# Patient Record
Sex: Male | Born: 1990 | Race: White | Hispanic: No | Marital: Single | Smoking: Current every day smoker
Health system: Southern US, Community
[De-identification: ages and names within clinical notes are randomized; demographics above are authoritative.]

## PROBLEM LIST (undated history)

## (undated) DIAGNOSIS — F32A Depression, unspecified: Secondary | ICD-10-CM

## (undated) DIAGNOSIS — F329 Major depressive disorder, single episode, unspecified: Secondary | ICD-10-CM

## (undated) DIAGNOSIS — F419 Anxiety disorder, unspecified: Secondary | ICD-10-CM

---
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
## 2010-06-04 ENCOUNTER — Emergency Department (HOSPITAL_COMMUNITY)
Admission: EM | Admit: 2010-06-04 | Discharge: 2010-06-04 | Disposition: A | Discharge status: Home / Self Care | Payer: Self-pay | Attending: Emergency Medicine | Admitting: Emergency Medicine

## 2010-06-04 DIAGNOSIS — R197 Diarrhea, unspecified: Secondary | ICD-10-CM | POA: Insufficient documentation

## 2010-06-04 DIAGNOSIS — R109 Unspecified abdominal pain: Secondary | ICD-10-CM | POA: Insufficient documentation

## 2010-06-04 DIAGNOSIS — R112 Nausea with vomiting, unspecified: Secondary | ICD-10-CM | POA: Insufficient documentation

## 2010-11-06 ENCOUNTER — Emergency Department (HOSPITAL_COMMUNITY)
Admission: EM | Admit: 2010-11-06 | Discharge: 2010-11-06 | Disposition: A | Discharge status: Home / Self Care | Payer: Self-pay | Attending: Emergency Medicine | Admitting: Emergency Medicine

## 2010-11-06 ENCOUNTER — Emergency Department (HOSPITAL_COMMUNITY): Payer: Self-pay

## 2010-11-06 ENCOUNTER — Encounter: Payer: Self-pay | Admitting: *Deleted

## 2010-11-06 DIAGNOSIS — S199XXA Unspecified injury of neck, initial encounter: Secondary | ICD-10-CM | POA: Insufficient documentation

## 2010-11-06 DIAGNOSIS — IMO0002 Reserved for concepts with insufficient information to code with codable children: Secondary | ICD-10-CM | POA: Insufficient documentation

## 2010-11-06 DIAGNOSIS — S0990XA Unspecified injury of head, initial encounter: Secondary | ICD-10-CM | POA: Insufficient documentation

## 2010-11-06 DIAGNOSIS — S0993XA Unspecified injury of face, initial encounter: Secondary | ICD-10-CM | POA: Insufficient documentation

## 2010-11-06 LAB — BASIC METABOLIC PANEL
BUN: 10 mg/dL (ref 6–23)
CO2: 27 mEq/L (ref 19–32)
Calcium: 10.4 mg/dL (ref 8.4–10.5)
Creatinine, Ser: 0.95 mg/dL (ref 0.50–1.35)
Glucose, Bld: 95 mg/dL (ref 70–99)

## 2010-11-06 LAB — DIFFERENTIAL
Eosinophils Absolute: 0.1 10*3/uL (ref 0.0–0.7)
Eosinophils Relative: 1 % (ref 0–5)
Lymphocytes Relative: 10 % — ABNORMAL LOW (ref 12–46)
Lymphs Abs: 1.9 10*3/uL (ref 0.7–4.0)
Monocytes Absolute: 1.1 10*3/uL — ABNORMAL HIGH (ref 0.1–1.0)

## 2010-11-06 LAB — CBC
HCT: 45.3 % (ref 39.0–52.0)
MCH: 32.9 pg (ref 26.0–34.0)
MCV: 92.4 fL (ref 78.0–100.0)
RBC: 4.9 MIL/uL (ref 4.22–5.81)
WBC: 18.2 10*3/uL — ABNORMAL HIGH (ref 4.0–10.5)

## 2010-11-06 MED ORDER — SODIUM CHLORIDE 0.9 % IV SOLN
Freq: Once | INTRAVENOUS | Status: AC
  Administered 2010-11-06: 06:00:00 via INTRAVENOUS

## 2010-11-06 NOTE — ED Notes (Signed)
Pt stating neck pain positive loc c-collar applied
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
## 2011-02-06 LAB — URINALYSIS, ROUTINE W REFLEX MICROSCOPIC
Bilirubin Urine: NEGATIVE
Glucose, UA: NEGATIVE
Hgb urine dipstick: NEGATIVE
Leukocytes, UA: NEGATIVE
Nitrite: NEGATIVE
Protein, ur: 30 — AB
Specific Gravity, Urine: 1.02
Urobilinogen, UA: 1
pH: 8.5 — ABNORMAL HIGH

## 2011-02-06 LAB — I-STAT 8, (EC8 V) (CONVERTED LAB)
BUN: 15
Chloride: 106
Glucose, Bld: 100 — ABNORMAL HIGH
Hemoglobin: 17.7 — ABNORMAL HIGH
pCO2, Ven: 36 — ABNORMAL LOW
pH, Ven: 7.435 — ABNORMAL HIGH

## 2011-02-06 LAB — URINE MICROSCOPIC-ADD ON

## 2014-06-30 ENCOUNTER — Encounter (HOSPITAL_COMMUNITY): Payer: Self-pay | Admitting: Emergency Medicine

## 2014-06-30 ENCOUNTER — Emergency Department (HOSPITAL_COMMUNITY)
Admission: EM | Admit: 2014-06-30 | Discharge: 2014-06-30 | Disposition: A | Discharge status: Home / Self Care | Payer: Self-pay | Attending: Emergency Medicine | Admitting: Emergency Medicine

## 2014-06-30 DIAGNOSIS — Z79899 Other long term (current) drug therapy: Secondary | ICD-10-CM | POA: Insufficient documentation

## 2014-06-30 DIAGNOSIS — K529 Noninfective gastroenteritis and colitis, unspecified: Secondary | ICD-10-CM | POA: Insufficient documentation

## 2014-06-30 DIAGNOSIS — Z720 Tobacco use: Secondary | ICD-10-CM | POA: Insufficient documentation

## 2014-06-30 LAB — BASIC METABOLIC PANEL
Anion gap: 9 (ref 5–15)
BUN: 13 mg/dL (ref 6–23)
CALCIUM: 10.1 mg/dL (ref 8.4–10.5)
CO2: 25 mmol/L (ref 19–32)
Chloride: 106 mmol/L (ref 96–112)
Creatinine, Ser: 0.95 mg/dL (ref 0.50–1.35)
GFR calc Af Amer: >90 mL/min (ref >90)
GLUCOSE: 93 mg/dL (ref 70–99)
Potassium: 4.4 mmol/L (ref 3.5–5.1)
SODIUM: 140 mmol/L (ref 135–145)

## 2014-06-30 MED ORDER — ONDANSETRON HCL 4 MG/2ML IJ SOLN
4.0000 mg | Freq: Once | INTRAMUSCULAR | Status: AC
  Administered 2014-06-30: 4 mg via INTRAVENOUS
  Filled 2014-06-30: qty 2

## 2014-06-30 MED ORDER — DIPHENOXYLATE-ATROPINE 2.5-0.025 MG PO TABS: 1.0000 | ORAL_TABLET | Freq: Four times a day (QID) | ORAL | Status: AC | PRN

## 2014-06-30 MED ORDER — GLYCOPYRROLATE 0.2 MG/ML IJ SOLN
0.1000 mg | Freq: Once | INTRAMUSCULAR | Status: AC
  Administered 2014-06-30: 0.1 mg via INTRAVENOUS
  Filled 2014-06-30: qty 1

## 2014-06-30 MED ORDER — DIPHENOXYLATE-ATROPINE 2.5-0.025 MG PO TABS
2.0000 | ORAL_TABLET | Freq: Once | ORAL | Status: AC
  Administered 2014-06-30: 2 via ORAL
  Filled 2014-06-30: qty 2

## 2014-06-30 MED ORDER — SODIUM CHLORIDE 0.9 % IV BOLUS (SEPSIS)
2000.0000 mL | Freq: Once | INTRAVENOUS | Status: AC
  Administered 2014-06-30: 2000 mL via INTRAVENOUS

## 2014-06-30 MED ORDER — ONDANSETRON 4 MG PO TBDP: 4.0000 mg | ORAL_TABLET | Freq: Three times a day (TID) | ORAL | Status: AC | PRN

## 2014-06-30 MED ORDER — DICYCLOMINE HCL 20 MG PO TABS: 20.0000 mg | ORAL_TABLET | Freq: Two times a day (BID) | ORAL | Status: AC

## 2014-06-30 NOTE — ED Provider Notes (Signed)
CSN: [id number]     Arrival date & time 06/30/14  1904 History  This chart was scribed for Rolland Porter, MD by Bronson Curb, ED Scribe. This patient was seen in room APA03/APA03 and the patient's care was started at 7:22 PM.   Chief Complaint  Patient presents with  . Emesis    The history is provided by the patient. No language interpreter was used.     HPI Comments: Lance Hines is a 24 y.o. male, with no significant medical history, who presents to the Emergency Department complaining of nausea with multiple episodes of vomiting that began today. Patient states he woke up with nausea and states it became progressively worse with vomiting occuring every 15 mintues. There is associated chills, 5 epiosdes of diarrhea, and mild abdominal discomfort. Patient has been trying to stay hydrated, but reports that he is unable to tolerate anything PO. He reports sick contacts with family experiencing similar symptoms, and reports they were treated yesterday. He denies fever or any other symptoms.   History reviewed. No pertinent past medical history. History reviewed. No pertinent past surgical history. History reviewed. No pertinent family history. History  Substance Use Topics  . Smoking status: Current Every Day Smoker -- 1.00 packs/day  . Smokeless tobacco: Not on file  . Alcohol Use: No    Review of Systems  Constitutional: Positive for chills. Negative for fever, diaphoresis, appetite change and fatigue.  HENT: Negative for mouth sores, sore throat and trouble swallowing.   Eyes: Negative for visual disturbance.  Respiratory: Negative for cough, chest tightness, shortness of breath and wheezing.   Cardiovascular: Negative for chest pain.  Gastrointestinal: Positive for nausea, vomiting, abdominal pain and diarrhea. Negative for abdominal distention.  Endocrine: Negative for polydipsia, polyphagia and polyuria.  Genitourinary: Negative for dysuria, frequency and hematuria.   Musculoskeletal: Negative for gait problem.  Skin: Negative for color change, pallor and rash.  Neurological: Negative for dizziness, syncope, light-headedness and headaches.  Hematological: Does not bruise/bleed easily.  Psychiatric/Behavioral: Negative for behavioral problems and confusion.      Allergies  Review of patient's allergies indicates no known allergies.  Home Medications   Prior to Admission medications   Medication Sig Start Date End Date Taking? Authorizing Provider  ALPRAZolam (XANAX PO) Take by mouth.      Historical Provider, MD  dicyclomine (BENTYL) 20 MG tablet Take 1 tablet (20 mg total) by mouth 2 (two) times daily. 06/30/14   Rolland Porter, MD  diphenoxylate-atropine (LOMOTIL) 2.5-0.025 MG per tablet Take 1 tablet by mouth 4 (four) times daily as needed for diarrhea or loose stools. 06/30/14   Rolland Porter, MD  ondansetron (ZOFRAN ODT) 4 MG disintegrating tablet Take 1 tablet (4 mg total) by mouth every 8 (eight) hours as needed for nausea. 06/30/14   Rolland Porter, MD   Triage Vitals: BP 99/56 mmHg  Pulse 83  Temp(Src) 97.9 F (36.6 C) (Oral)  Resp 24  Ht  (1.854 m)  Wt 220 lb (99.791 kg)  BMI 29.03 kg/m2  SpO2 100%  Physical Exam  Constitutional: He is oriented to person, place, and time. He appears well-developed and well-nourished. No distress.  HENT:  Head: Normocephalic.  Mouth/Throat: Mucous membranes are dry.  Eyes: Conjunctivae are normal. Pupils are equal, round, and reactive to light. No scleral icterus.  Neck: Normal range of motion. Neck supple. No thyromegaly present.  Cardiovascular: Normal rate and regular rhythm.  Exam reveals no gallop and no friction rub.   No murmur  heard. Pulmonary/Chest: Effort normal and breath sounds normal. No respiratory distress. He has no wheezes. He has no rales.  Abdominal: Soft. He exhibits no distension. Bowel sounds are decreased. There is no tenderness. There is no rebound.  Mild tenderness. Slightly  hypoactive bowel sounds.  Musculoskeletal: Normal range of motion.  Neurological: He is alert and oriented to person, place, and time.  Skin: Skin is warm and dry. No rash noted.  Psychiatric: He has a normal mood and affect. His behavior is normal.    ED Course  Procedures (including critical care time)  DIAGNOSTIC STUDIES: Oxygen Saturation is 100% on room air, normal by my interpretation.    COORDINATION OF CARE: At 1926 Discussed treatment plan with patient. Patient agrees.   Labs Review Labs Reviewed  BASIC METABOLIC PANEL    Imaging Review No results found.   EKG Interpretation None      MDM   Final diagnoses:  Gastroenteritis   is much improved after IV hydration, antiemetics, Lomotil. Labs reassuring. Plan is discharge home.   I personally performed the services described in this documentation, which was scribed in my presence. The recorded information has been reviewed and is accurate.    Rolland PorterMark Shimeka Bacot, MD 06/30/14 2051

## 2014-06-30 NOTE — ED Notes (Signed)
Onset this morning vomiting and diarrhea, multiple times all day

## 2014-06-30 NOTE — Discharge Instructions (Signed)
Viral Gastroenteritis Viral gastroenteritis is also called stomach flu. This illness is caused by a certain type of germ (virus). It can cause sudden watery poop (diarrhea) and throwing up (vomiting). This can cause you to lose body fluids (dehydration). This illness usually lasts for 3 to 8 days. It usually goes away on its own. HOME CARE   Drink enough fluids to keep your pee (urine) clear or pale yellow. Drink small amounts of fluids often.  Ask your doctor how to replace body fluid losses (rehydration).  Avoid:  Foods high in sugar.  Alcohol.  Bubbly (carbonated) drinks.  Tobacco.  Juice.  Caffeine drinks.  Very hot or cold fluids.  Fatty, greasy foods.  Eating too much at one time.  Dairy products until 24 to 48 hours after your watery poop stops.  You may eat foods with active cultures (probiotics). They can be found in some yogurts and supplements.  Wash your hands well to avoid spreading the illness.  Only take medicines as told by your doctor. Do not give aspirin to children. Do not take medicines for watery poop (antidiarrheals).  Ask your doctor if you should keep taking your regular medicines.  Keep all doctor visits as told. GET HELP RIGHT AWAY IF:   You cannot keep fluids down.  You do not pee at least once every 6 to 8 hours.  You are short of breath.  You see blood in your poop or throw up. This may look like coffee grounds.  You have belly (abdominal) pain that gets worse or is just in one small spot (localized).  You keep throwing up or having watery poop.  You have a fever.  The patient is a child younger than 3 months, and he or she has a fever.  The patient is a child older than 3 months, and he or she has a fever and problems that do not go away.  The patient is a child older than 3 months, and he or she has a fever and problems that suddenly get worse.  The patient is a baby, and he or she has no tears when crying. MAKE SURE YOU:     Understand these instructions.  Will watch your condition.  Will get help right away if you are not doing well or get worse. Document Released: 10/03/2007 Document Revised: 07/09/2011 Document Reviewed: 01/31/2011 Exit[employer] Patient Information 2015 [employer]. This information is not intended to replace advice given to you by your health care provider. Make sure you discuss any questions you have with your health care provider.  

## 2014-07-26 ENCOUNTER — Encounter (HOSPITAL_COMMUNITY): Payer: Self-pay

## 2014-07-26 ENCOUNTER — Emergency Department (HOSPITAL_COMMUNITY)
Admission: EM | Admit: 2014-07-26 | Discharge: 2014-07-27 | Disposition: A | Discharge status: Home / Self Care | Payer: Self-pay | Attending: Emergency Medicine | Admitting: Emergency Medicine

## 2014-07-26 DIAGNOSIS — F329 Major depressive disorder, single episode, unspecified: Secondary | ICD-10-CM | POA: Insufficient documentation

## 2014-07-26 DIAGNOSIS — F419 Anxiety disorder, unspecified: Secondary | ICD-10-CM | POA: Insufficient documentation

## 2014-07-26 DIAGNOSIS — R451 Restlessness and agitation: Secondary | ICD-10-CM

## 2014-07-26 DIAGNOSIS — F1210 Cannabis abuse, uncomplicated: Secondary | ICD-10-CM | POA: Insufficient documentation

## 2014-07-26 DIAGNOSIS — R45851 Suicidal ideations: Secondary | ICD-10-CM

## 2014-07-26 DIAGNOSIS — F1110 Opioid abuse, uncomplicated: Secondary | ICD-10-CM | POA: Insufficient documentation

## 2014-07-26 DIAGNOSIS — Z720 Tobacco use: Secondary | ICD-10-CM | POA: Insufficient documentation

## 2014-07-26 DIAGNOSIS — Z79899 Other long term (current) drug therapy: Secondary | ICD-10-CM | POA: Insufficient documentation

## 2014-07-26 HISTORY — DX: Depression, unspecified: F32.A

## 2014-07-26 HISTORY — DX: Major depressive disorder, single episode, unspecified: F32.9

## 2014-07-26 HISTORY — DX: Anxiety disorder, unspecified: F41.9

## 2014-07-26 LAB — COMPREHENSIVE METABOLIC PANEL
ALBUMIN: 5.3 g/dL — AB (ref 3.5–5.2)
ALT: 20 U/L (ref 0–53)
AST: 18 U/L (ref 0–37)
Alkaline Phosphatase: 88 U/L (ref 39–117)
Anion gap: 9 (ref 5–15)
BUN: 9 mg/dL (ref 6–23)
CALCIUM: 10 mg/dL (ref 8.4–10.5)
CO2: 29 mmol/L (ref 19–32)
Chloride: 104 mmol/L (ref 96–112)
Creatinine, Ser: 0.9 mg/dL (ref 0.50–1.35)
GFR calc Af Amer: >90 mL/min (ref >90)
GFR calc non Af Amer: >90 mL/min (ref >90)
Glucose, Bld: 94 mg/dL (ref 70–99)
Potassium: 4.4 mmol/L (ref 3.5–5.1)
Sodium: 142 mmol/L (ref 135–145)
TOTAL PROTEIN: 8.1 g/dL (ref 6.0–8.3)
Total Bilirubin: 1 mg/dL (ref 0.3–1.2)

## 2014-07-26 LAB — URINALYSIS, ROUTINE W REFLEX MICROSCOPIC
Bilirubin Urine: NEGATIVE
Glucose, UA: NEGATIVE mg/dL
Hgb urine dipstick: NEGATIVE
Ketones, ur: NEGATIVE mg/dL
LEUKOCYTES UA: NEGATIVE
NITRITE: NEGATIVE
PROTEIN: NEGATIVE mg/dL
Specific Gravity, Urine: 1.017 (ref 1.005–1.030)
UROBILINOGEN UA: 0.2 mg/dL (ref 0.0–1.0)
pH: 7.5 (ref 5.0–8.0)

## 2014-07-26 LAB — RAPID URINE DRUG SCREEN, HOSP PERFORMED
Amphetamines: NOT DETECTED
BENZODIAZEPINES: NOT DETECTED
Barbiturates: NOT DETECTED
COCAINE: NOT DETECTED
Opiates: NOT DETECTED
Tetrahydrocannabinol: POSITIVE — AB

## 2014-07-26 LAB — URINE MICROSCOPIC-ADD ON

## 2014-07-26 LAB — CBC
HCT: 47.1 % (ref 39.0–52.0)
Hemoglobin: 15.9 g/dL (ref 13.0–17.0)
MCH: 32.3 pg (ref 26.0–34.0)
MCHC: 33.8 g/dL (ref 30.0–36.0)
MCV: 95.5 fL (ref 78.0–100.0)
PLATELETS: 247 10*3/uL (ref 150–400)
RBC: 4.93 MIL/uL (ref 4.22–5.81)
RDW: 12 % (ref 11.5–15.5)
WBC: 8 10*3/uL (ref 4.0–10.5)

## 2014-07-26 LAB — SALICYLATE LEVEL: Salicylate Lvl: <4 mg/dL (ref 2.8–20.0)

## 2014-07-26 LAB — ACETAMINOPHEN LEVEL

## 2014-07-26 LAB — ETHANOL

## 2014-07-26 MED ORDER — ALUM & MAG HYDROXIDE-SIMETH 200-200-20 MG/5ML PO SUSP
30.0000 mL | ORAL | Status: DC | PRN
Start: 2014-07-26 — End: 2014-07-27

## 2014-07-26 MED ORDER — ZOLPIDEM TARTRATE 5 MG PO TABS
5.0000 mg | ORAL_TABLET | Freq: Every evening | ORAL | Status: DC | PRN
  Administered 2014-07-26: 5 mg via ORAL
  Filled 2014-07-26: qty 1

## 2014-07-26 MED ORDER — HALOPERIDOL 5 MG PO TABS: 5.0000 mg | ORAL_TABLET | Freq: Once | ORAL | Status: DC

## 2014-07-26 MED ORDER — IBUPROFEN 200 MG PO TABS: 600.0000 mg | ORAL_TABLET | Freq: Three times a day (TID) | ORAL | Status: DC | PRN

## 2014-07-26 MED ORDER — LORAZEPAM 1 MG PO TABS
1.0000 mg | ORAL_TABLET | Freq: Three times a day (TID) | ORAL | Status: DC | PRN
  Administered 2014-07-26 – 2014-07-27 (×2): 1 mg via ORAL
  Filled 2014-07-26 (×2): qty 1

## 2014-07-26 MED ORDER — ONDANSETRON HCL 4 MG PO TABS: 4.0000 mg | ORAL_TABLET | Freq: Three times a day (TID) | ORAL | Status: DC | PRN

## 2014-07-26 NOTE — ED Notes (Signed)
Patient remains anxious. Reports racing thoughts. Concerned about how long he will be on the unit before being placed for tx.

## 2014-07-26 NOTE — ED Notes (Signed)
Patient appears flat. Denies SI, HI, AVH. Rates feelings of anxiety 6/10, depression 8/10.   Encouragement offered.   Q 15 safety checks in place.

## 2014-07-26 NOTE — ED Notes (Signed)
Pt reported to MD that sometimes he gets so mad that he wants to "beat someone to death."  Sts he feels "worthless" and angry from the time he wakes to the time he goes to bed.
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
## 2014-07-26 NOTE — ED Notes (Signed)
Pt's visitor reports "I know, him and he needs Seroquel. He needs major help.  Three days will not be enough wherever he goes."
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
## 2014-07-26 NOTE — ED Notes (Signed)
Patient pacing in his room. Reports that anxiety continues. Calm, cooperative. Agrees to take an Ativan.  Q 15 safety checks continue.

## 2014-07-27 DIAGNOSIS — F1994 Other psychoactive substance use, unspecified with psychoactive substance-induced mood disorder: Secondary | ICD-10-CM

## 2014-07-27 DIAGNOSIS — F1120 Opioid dependence, uncomplicated: Secondary | ICD-10-CM

## 2014-07-27 NOTE — Progress Notes (Signed)
Pt was pacing in his room. Upon approach, he said he was feeling anxious after a phone call in which he learned his mom had been going through his phone. He said he felt like he needed Xanax and Ambien. Offered Ativan, which he accepted. Pt said he felt he should call his mom but said he was so upset. This Clinical research associatewriter suggested he try to relax before he makes any potentially upsetting calls. Pt remains appropriate and polite. Will continue to monitor for needs/safety.

## 2014-07-27 NOTE — ED Notes (Signed)
Pt acuity note: Low Pt denies SI/HI/AVH. Says he is here to detox from Opanna. No physical complaints. Will continue to monitor for needs/safety.

## 2014-07-27 NOTE — Progress Notes (Signed)
Per psychiatrist, patient psychiatrically stable for discharge home. Pt shares he as just released from jail after being their for 15 days. Pt wanting to go to rehab. Patient also interested in suboxone clinic. CSW and pt discussed resources. Patient calling [facility] for information and intake. Patient plans to follow up with [facility] in [place] for mental health treatment. Pt states he can have a ride home at 2pm.   Olga CoasterKristen Jelisha Weed, LCSW  Clinical Social Work  [facility] [phone]

## 2014-07-27 NOTE — ED Notes (Signed)
Pt acuity note: Low Pt is calm, cooperative, and polite.
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
## 2014-07-27 NOTE — ED Notes (Signed)
Pt is on phone now. He reports depression 8/10 but denies SI/HI.

## 2014-07-27 NOTE — ED Notes (Addendum)
Pt's girlfriend, Davonna BellingGabriela Pike [phone]), called to express concerns about pt's discharge. "He cannot be discharged," she said. Added that pt was having SI and would be a danger if released. Pt has denied SI. "I can't lie about it," he said. MD and LCSW notified. Pt now is on phone making phone calls about treatment options.

## 2014-07-27 NOTE — [facility] Suicide Risk Assessment (Signed)
[facility]   Demographic Factors:  24 year old male  Total Time spent with patient: 45 minutes  Musculoskeletal: Strength & Muscle Tone: within normal limits Gait & Station: normal Patient leans: N/A  Psychiatric Specialty Exam: Physical Exam  ROS  Blood pressure 123/78, pulse 82, temperature 98.1 F (36.7 C), temperature source Oral, resp. rate 16, SpO2 100 %.There is no weight on file to calculate BMI.  General Appearance: Well Groomed  Patent attorneyye Contact::  Good  Speech:  Clear and Coherent409  Volume:  Normal  Mood:  Anxious  Affect:  Appropriate  Thought Process:  Coherent  Orientation:  Full (Time, Place, and Person)  Thought Content:  Negative  Suicidal Thoughts:  No  Homicidal Thoughts:  No  Memory:  Immediate;   Good Recent;   Good Remote;   Good  Judgement:  Fair  Insight:  Fair  Psychomotor Activity:  Normal  Concentration:  Good  Recall:  Good  Fund of Knowledge:Good  Language: Good  Akathisia:  Negative  Handed:  Right  AIMS (if indicated):     Assets:  Communication Skills Desire for Improvement Physical Health  Sleep:     Cognition: WNL  ADL's:  Intact      Has this patient used any form of tobacco in the last 30 days? (Cigarettes, Smokeless Tobacco, Cigars, and/or Pipes) N/A  Mental Status Per Nursing Assessment::   On Admission:     Current Mental Status by Physician: NA  Loss Factors: NA  Historical Factors: NA  Risk Reduction Factors:   NA  Continued Clinical Symptoms:  Alcohol/Substance Abuse/Dependencies  Cognitive Features That Contribute To Risk:  Closed-mindedness    Suicide Risk:  Minimal: No identifiable suicidal ideation.  Patients presenting with no risk factors but with morbid ruminations; may be classified as minimal risk based on the severity of the depressive symptoms  Principal Problem: <principal problem not specified> Discharge Diagnoses: There are no active problems to display for this  patient.   Follow-up Information    [facility]   Contact information:   [phone]      Please follow up.   Why:  walk in hours 530 am to 930 am   Contact information:   [facility]  [address] [place], [place] [address] [phone]       Please follow up.   Contact information:   [facility]  [address], [place], [place] [address] [phone]      Plan Of Care/Follow-up recommendations:  Activity:  resume usual activity Diet:  resume usual diet  Is patient on multiple antipsychotic therapies at discharge:  No   Has Patient had three or more failed trials of antipsychotic monotherapy by history:  No  Recommended Plan for Multiple Antipsychotic Therapies: NA    TAYLOR,GERALD D 07/27/2014, 12:25 PM

## 2014-07-27 NOTE — ED Notes (Signed)
Pt discharged to lobby by [facility] with belongings. Pt stable, denies pain, and denies SI. AVS given, resource numbers provided. [facility]

## 2017-10-28 ENCOUNTER — Encounter (HOSPITAL_COMMUNITY): Payer: Self-pay | Admitting: Emergency Medicine

## 2017-10-28 ENCOUNTER — Emergency Department (HOSPITAL_COMMUNITY)
Admission: EM | Admit: 2017-10-28 | Discharge: 2017-10-29 | Disposition: A | Discharge status: Home / Self Care | Payer: Self-pay | Attending: Emergency Medicine | Admitting: Emergency Medicine

## 2017-10-28 ENCOUNTER — Other Ambulatory Visit: Payer: Self-pay

## 2017-10-28 DIAGNOSIS — S30861A Insect bite (nonvenomous) of abdominal wall, initial encounter: Secondary | ICD-10-CM | POA: Insufficient documentation

## 2017-10-28 DIAGNOSIS — W57XXXA Bitten or stung by nonvenomous insect and other nonvenomous arthropods, initial encounter: Secondary | ICD-10-CM

## 2017-10-28 DIAGNOSIS — Y929 Unspecified place or not applicable: Secondary | ICD-10-CM | POA: Insufficient documentation

## 2017-10-28 DIAGNOSIS — Y999 Unspecified external cause status: Secondary | ICD-10-CM | POA: Insufficient documentation

## 2017-10-28 DIAGNOSIS — F17210 Nicotine dependence, cigarettes, uncomplicated: Secondary | ICD-10-CM | POA: Insufficient documentation

## 2017-10-28 DIAGNOSIS — Y939 Activity, unspecified: Secondary | ICD-10-CM | POA: Insufficient documentation

## 2017-10-28 MED ORDER — DOXYCYCLINE HYCLATE 100 MG PO CAPS: 100.0000 mg | ORAL_CAPSULE | Freq: Two times a day (BID) | ORAL | 0 refills | Status: AC

## 2017-10-28 MED ORDER — PROMETHAZINE HCL 25 MG PO TABS: 25.0000 mg | ORAL_TABLET | Freq: Four times a day (QID) | ORAL | 0 refills | Status: AC | PRN

## 2017-10-28 MED ORDER — PROMETHAZINE HCL 12.5 MG PO TABS
25.0000 mg | ORAL_TABLET | Freq: Once | ORAL | Status: AC
  Administered 2017-10-29: 25 mg via ORAL
  Filled 2017-10-28: qty 2

## 2017-10-28 NOTE — ED Provider Notes (Signed)
[facility] EMERGENCY DEPARTMENT Provider Note   CSN: [id number] Arrival date & time: 10/28/17  2222     History   Chief Complaint Chief Complaint  Patient presents with  . Tick bite    HPI Lance Hines is a 27 y.o. male.  HPI   Lance Hines is a 27 y.o. male who presents to the Emergency Department complaining of tick bite 2-3 weeks ago.  States that he noticed a "dog tick" to his right lower abdomen and removed himself when first noticed.  Tick was not engorged.  Several days ago, he noticed red ring around the site of the tick bite and associated with localized itching.  He also describes having generalized body aches, nausea, decreased appetite and " I just don't feel right."  He denies fever, vomiting, arthralgias, rash, headache and neck pain or stiffness.  He is concerned that he may have Lyme disease.      Past Medical History:  Diagnosis Date  . Anxiety   . Depression     There are no active problems to display for this patient.   History reviewed. No pertinent surgical history.      Home Medications    Prior to Admission medications   Medication Sig Start Date End Date Taking? Authorizing Provider  dicyclomine (BENTYL) 20 MG tablet Take 1 tablet (20 mg total) by mouth 2 (two) times daily. Patient not taking: Reported on 07/26/2014 06/30/14   Rolland Porter, MD  diphenoxylate-atropine (LOMOTIL) 2.5-0.025 MG per tablet Take 1 tablet by mouth 4 (four) times daily as needed for diarrhea or loose stools. Patient not taking: Reported on 07/26/2014 06/30/14   Rolland Porter, MD  doxycycline (VIBRAMYCIN) 100 MG capsule Take 1 capsule (100 mg total) by mouth 2 (two) times daily. 10/28/17   Honore Wipperfurth, PA-C  hydrOXYzine (ATARAX/VISTARIL) 25 MG tablet Take 25 mg by mouth 2 (two) times daily.    [provider]  ondansetron (ZOFRAN ODT) 4 MG disintegrating tablet Take 1 tablet (4 mg total) by mouth every 8 (eight) hours as needed for nausea. Patient not taking:  Reported on 07/26/2014 06/30/14   Rolland Porter, MD  promethazine (PHENERGAN) 25 MG tablet Take 1 tablet (25 mg total) by mouth every 6 (six) hours as needed for nausea or vomiting. 10/28/17   Giovan Pinsky, Babette Relic, PA-C    Family History No family history on file.  Social History Social History   Tobacco Use  . Smoking status: Current Every Day Smoker    Packs/day: 1.00    Types: Cigarettes  . Smokeless tobacco: Never Used  Substance Use Topics  . Alcohol use: No  . Drug use: Yes    Comment: opiates     Allergies   Patient has no known allergies.   Review of Systems Review of Systems  Constitutional: Positive for appetite change and fatigue. Negative for activity change, chills and fever.  HENT: Negative for facial swelling, sore throat and trouble swallowing.   Respiratory: Negative for chest tightness, shortness of breath and wheezing.   Cardiovascular: Negative for chest pain.  Gastrointestinal: Positive for nausea. Negative for abdominal pain and vomiting.  Musculoskeletal: Positive for myalgias. Negative for arthralgias, neck pain and neck stiffness.  Skin: Positive for rash. Negative for wound.       Tick bite  Neurological: Negative for dizziness, weakness, numbness and headaches.     Physical Exam Updated Vital Signs BP (!) 145/99 (BP Location: Right Arm)   Pulse 86   Temp 98.1 F (36.7  C) (Temporal)   Resp 18   Ht 6\' 1"  (1.854 m)   Wt 113.4 kg (250 lb)   SpO2 98%   BMI 32.98 kg/m   Physical Exam  Constitutional: He appears well-developed and well-nourished. No distress.  HENT:  Head: Normocephalic and atraumatic.  Mouth/Throat: Oropharynx is clear and moist.  Neck: Normal range of motion. Neck supple.  Cardiovascular: Normal rate, regular rhythm and intact distal pulses.  No murmur heard. Pulmonary/Chest: Effort normal and breath sounds normal. No respiratory distress.  Musculoskeletal: He exhibits no edema or tenderness.  Lymphadenopathy:    He has no  cervical adenopathy.  Neurological: He is alert. No sensory deficit. He exhibits normal muscle tone.  Skin: Skin is warm. Capillary refill takes less than 2 seconds. Rash noted. There is erythema.  Small erythematous papule to the right lower abdomen with erythematous ring with central clearing.    Nursing note and vitals reviewed.    ED Treatments / Results  Labs (all labs ordered are listed, but only abnormal results are displayed) Labs Reviewed - No data to display  EKG None  Radiology No results found.  Procedures Procedures (including critical care time)  Medications Ordered in ED Medications  promethazine (PHENERGAN) tablet 25 mg (has no administration in time range)     Initial Impression / Assessment and Plan / ED Course  I have reviewed the triage vital signs and the nursing notes.  Pertinent labs & imaging results that were available during my care of the patient were reviewed by me and considered in my medical decision making (see chart for details).     Pt is well appearing.  Vitals reviewed.  Small amt of irregular erythema surrounding the site of the tick bite.  No other associated symptoms.  Will start doxy.  Pt agrees to PCP f/u, return precautions discussed.   Final Clinical Impressions(s) / ED Diagnoses   Final diagnoses:  Tick bite of abdominal wall, initial encounter    ED Discharge Orders        Ordered    doxycycline (VIBRAMYCIN) 100 MG capsule  2 times daily     10/28/17 2319    promethazine (PHENERGAN) 25 MG tablet  Every 6 hours PRN     10/28/17 2319       Pauline Ausriplett, Analiese Krupka, PA-C 10/28/17 2354    Zadie RhineWickline, Donald, MD 10/29/17 [id number]

## 2017-10-28 NOTE — Discharge Instructions (Addendum)
Take the antibiotic as directed until it is finished.  Do not take on an empty stomach.  Also, apply 1% hydrocortisone cream that you may buy from the drugstore to the affected area 3 times a day.  You may take Benadryl as directed if needed for itching.  Follow-up with your primary doctor or return to the ER for any worsening symptoms such as fever, severe headache, vomiting, or worsening rash

## 2017-10-28 NOTE — ED Triage Notes (Signed)
Pt states he was bit by a tick around 2-3 weeks ago. Pt states since then he has not been able to eat and "has not been himself." Pt denies fevers and N/V.

## 2019-01-29 ENCOUNTER — Other Ambulatory Visit: Payer: Self-pay | Admitting: *Deleted

## 2019-01-29 DIAGNOSIS — Z20822 Contact with and (suspected) exposure to covid-19: Secondary | ICD-10-CM

## 2019-01-30 LAB — NOVEL CORONAVIRUS, NAA: SARS-CoV-2, NAA: NOT DETECTED

## 2020-07-19 ENCOUNTER — Other Ambulatory Visit: Payer: Self-pay

## 2020-07-19 ENCOUNTER — Encounter (HOSPITAL_COMMUNITY): Payer: Self-pay

## 2020-07-19 ENCOUNTER — Emergency Department (HOSPITAL_COMMUNITY)
Admission: EM | Admit: 2020-07-19 | Discharge: 2020-07-19 | Disposition: A | Discharge status: Home / Self Care | Payer: Self-pay | Attending: Emergency Medicine | Admitting: Emergency Medicine

## 2020-07-19 ENCOUNTER — Emergency Department (HOSPITAL_COMMUNITY): Payer: Self-pay

## 2020-07-19 DIAGNOSIS — K644 Residual hemorrhoidal skin tags: Secondary | ICD-10-CM | POA: Insufficient documentation

## 2020-07-19 DIAGNOSIS — M545 Low back pain, unspecified: Secondary | ICD-10-CM | POA: Insufficient documentation

## 2020-07-19 DIAGNOSIS — R319 Hematuria, unspecified: Secondary | ICD-10-CM | POA: Insufficient documentation

## 2020-07-19 DIAGNOSIS — N50812 Left testicular pain: Secondary | ICD-10-CM | POA: Insufficient documentation

## 2020-07-19 DIAGNOSIS — F17210 Nicotine dependence, cigarettes, uncomplicated: Secondary | ICD-10-CM | POA: Insufficient documentation

## 2020-07-19 DIAGNOSIS — N50811 Right testicular pain: Secondary | ICD-10-CM | POA: Insufficient documentation

## 2020-07-19 LAB — CBC WITH DIFFERENTIAL/PLATELET
Abs Immature Granulocytes: 0.02 10*3/uL (ref 0.00–0.07)
Basophils Absolute: 0.1 10*3/uL (ref 0.0–0.1)
Basophils Relative: 1 %
Eosinophils Absolute: 0.1 10*3/uL (ref 0.0–0.5)
Eosinophils Relative: 1 %
HCT: 44.1 % (ref 39.0–52.0)
Hemoglobin: 15.3 g/dL (ref 13.0–17.0)
Immature Granulocytes: 0 %
Lymphocytes Relative: 34 %
Lymphs Abs: 2.4 10*3/uL (ref 0.7–4.0)
MCH: 33.3 pg (ref 26.0–34.0)
MCHC: 34.7 g/dL (ref 30.0–36.0)
MCV: 95.9 fL (ref 80.0–100.0)
Monocytes Absolute: 0.5 10*3/uL (ref 0.1–1.0)
Monocytes Relative: 7 %
Neutro Abs: 3.9 10*3/uL (ref 1.7–7.7)
Neutrophils Relative %: 57 %
Platelets: 256 10*3/uL (ref 150–400)
RBC: 4.6 MIL/uL (ref 4.22–5.81)
RDW: 12.4 % (ref 11.5–15.5)
WBC: 6.9 10*3/uL (ref 4.0–10.5)
nRBC: 0 % (ref 0.0–0.2)

## 2020-07-19 LAB — BASIC METABOLIC PANEL
Anion gap: 7 (ref 5–15)
BUN: 9 mg/dL (ref 6–20)
CO2: 28 mmol/L (ref 22–32)
Calcium: 9.7 mg/dL (ref 8.9–10.3)
Chloride: 107 mmol/L (ref 98–111)
Creatinine, Ser: 0.88 mg/dL (ref 0.61–1.24)
GFR, Estimated: >60 mL/min (ref >60)
Glucose, Bld: 83 mg/dL (ref 70–99)
Potassium: 4.2 mmol/L (ref 3.5–5.1)
Sodium: 142 mmol/L (ref 135–145)

## 2020-07-19 LAB — URINALYSIS, ROUTINE W REFLEX MICROSCOPIC
Bacteria, UA: NONE SEEN
Bilirubin Urine: NEGATIVE
Glucose, UA: NEGATIVE mg/dL
Hgb urine dipstick: NEGATIVE
Ketones, ur: NEGATIVE mg/dL
Leukocytes,Ua: NEGATIVE
Nitrite: POSITIVE — AB
Protein, ur: NEGATIVE mg/dL
Specific Gravity, Urine: 1.01 (ref 1.005–1.030)
pH: 8 (ref 5.0–8.0)

## 2020-07-19 NOTE — ED Provider Notes (Signed)
[facility]-EMERGENCY DEPT Provider Note   CSN: [id number] Arrival date & time: 07/19/20  1556     History Chief Complaint  Patient presents with  . Hematuria  . Back Pain    Lance Hines is a 30 y.o. male who presents with 3 days of blood in his urine and in his semen associated with bilateral low back pain which he describes as aching pain 5/10 in severity.  He denies any pain in his penis, deformity, or skin lesions.  He endorses intermittent sore sensation in his testicles bilaterally without swelling, skin changes, or redness.  He states he is sexually active daily, currently has approximately 107 active male sexual partners.  Denies any sexual activity with men.  He denies any history of sexually transmitted infection, but states he regularly has multiple sexual partners.  He states he is sexually active daily, denies any history of hematuria in the past.  He does not use barrier protection with condoms when he is sexually active.  He states that he does not know of any recent exposure to sexually transmitted infections.  I personally reviewed this patient's medical records.  Has history of anxiety and depression, hydroxyzine. He otherwise carries no medical diagnoses.   HPI     Past Medical History:  Diagnosis Date  . Anxiety   . Depression     There are no problems to display for this patient.   History reviewed. No pertinent surgical history.     Family History  Family history unknown: Yes    Social History   Tobacco Use  . Smoking status: Current Every Day Smoker    Packs/day: 1.00    Types: Cigarettes  . Smokeless tobacco: Never Used  Vaping Use  . Vaping Use: Never used  Substance Use Topics  . Alcohol use: No  . Drug use: Not Currently    Home Medications Prior to Admission medications   Medication Sig Start Date End Date Taking? Authorizing Provider  dicyclomine (BENTYL) 20 MG tablet Take 1 tablet (20 mg total) by mouth 2  (two) times daily. Patient not taking: Reported on 07/26/2014 06/30/14   Rolland Porter, MD  diphenoxylate-atropine (LOMOTIL) 2.5-0.025 MG per tablet Take 1 tablet by mouth 4 (four) times daily as needed for diarrhea or loose stools. Patient not taking: Reported on 07/26/2014 06/30/14   Rolland Porter, MD  doxycycline (VIBRAMYCIN) 100 MG capsule Take 1 capsule (100 mg total) by mouth 2 (two) times daily. 10/28/17   Triplett, Tammy, PA-C  hydrOXYzine (ATARAX/VISTARIL) 25 MG tablet Take 25 mg by mouth 2 (two) times daily.    [provider]  ondansetron (ZOFRAN ODT) 4 MG disintegrating tablet Take 1 tablet (4 mg total) by mouth every 8 (eight) hours as needed for nausea. Patient not taking: Reported on 07/26/2014 06/30/14   Rolland Porter, MD  promethazine (PHENERGAN) 25 MG tablet Take 1 tablet (25 mg total) by mouth every 6 (six) hours as needed for nausea or vomiting. 10/28/17   Triplett, Tammy, PA-C    Allergies    Patient has no known allergies.  Review of Systems   Review of Systems  Constitutional: Negative.   HENT: Negative.   Eyes: Negative.   Respiratory: Negative.   Cardiovascular: Negative.   Gastrointestinal: Negative.   Genitourinary: Positive for decreased urine volume, flank pain, hematuria and testicular pain. Negative for difficulty urinating, dysuria, frequency, penile discharge, penile pain, penile swelling, scrotal swelling and urgency.  Skin: Negative.   Neurological: Negative.  Hematological: Negative.     Physical Exam Updated Vital Signs BP 112/67   Pulse 61   Temp 97.7 F (36.5 C) (Oral)   Resp 14   Ht  (1.854 m)   Wt 104.3 kg   SpO2 99%   BMI 30.34 kg/m   Physical Exam Vitals and nursing note reviewed. Exam conducted with a chaperone present.  HENT:     Head: Normocephalic and atraumatic.     Nose: Nose normal.     Mouth/Throat:     Mouth: Mucous membranes are moist.     Pharynx: Oropharynx is clear. Uvula midline. No oropharyngeal exudate or posterior  oropharyngeal erythema.     Tonsils: No tonsillar exudate.  Eyes:     General: Lids are normal. Vision grossly intact.        Right eye: No discharge.        Left eye: No discharge.     Extraocular Movements: Extraocular movements intact.     Conjunctiva/sclera: Conjunctivae normal.     Pupils: Pupils are equal, round, and reactive to light.  Neck:     Trachea: Phonation normal.  Cardiovascular:     Rate and Rhythm: Normal rate and regular rhythm.     Pulses: Normal pulses.     Heart sounds: Normal heart sounds. No murmur heard.   Pulmonary:     Effort: Pulmonary effort is normal. No respiratory distress.     Breath sounds: Normal breath sounds. No wheezing or rales.  Chest:     Chest wall: No deformity, swelling, tenderness, crepitus or edema.  Abdominal:     General: Bowel sounds are normal. There is no distension.     Palpations: Abdomen is soft.     Tenderness: There is no abdominal tenderness. There is no right CVA tenderness, left CVA tenderness, guarding or rebound.  Genitourinary:    Penis: Normal and circumcised. No tenderness, discharge, swelling or lesions.      Testes: Normal.        Right: Mass, tenderness, swelling, testicular hydrocele or varicocele not present.        Left: Mass, tenderness, swelling, testicular hydrocele or varicocele not present.     Epididymis:     Right: Normal.     Left: Normal.     Prostate: Normal. Not enlarged, not tender and no nodules present.     Rectum: External hemorrhoid present. No mass, tenderness, anal fissure or internal hemorrhoid.     Comments: Rectal exam with normal-feeling prostate without tenderness to palpation.  Prostate is not boggy or edematous, is not firm to palpation.  Stool on glove does not appear melanotic.   Musculoskeletal:        General: No deformity.     Cervical back: Normal range of motion and neck supple. No rigidity, tenderness or crepitus. No pain with movement or spinous process tenderness.     Right  lower leg: No edema.     Left lower leg: No edema.  Lymphadenopathy:     Cervical: No cervical adenopathy.  Skin:    General: Skin is warm and dry.     Capillary Refill: Capillary refill takes less than 2 seconds.  Neurological:     Mental Status: He is alert and oriented to person, place, and time. Mental status is at baseline.     Sensory: Sensation is intact.     Motor: Motor function is intact.     Gait: Gait is intact.  Psychiatric:  Mood and Affect: Mood normal.     ED Results / Procedures / Treatments   Labs (all labs ordered are listed, but only abnormal results are displayed) Labs Reviewed  URINALYSIS, ROUTINE W REFLEX MICROSCOPIC - Abnormal; Notable for the following components:      Result Value   Color, Urine AMBER (*)    Nitrite POSITIVE (*)    All other components within normal limits  URINE CULTURE  BASIC METABOLIC PANEL  CBC WITH DIFFERENTIAL/PLATELET  GC/CHLAMYDIA PROBE AMP ([place]) NOT AT [facility]    EKG None  Radiology CT Renal Stone Study  Result Date: 07/19/2020 CLINICAL DATA:  Flank pain and hematuria. EXAM: CT ABDOMEN AND PELVIS WITHOUT CONTRAST TECHNIQUE: Multidetector CT imaging of the abdomen and pelvis was performed following the standard protocol without IV contrast. COMPARISON:  None. FINDINGS: Lower chest: No acute findings. Hepatobiliary: No mass visualized on this unenhanced exam. Gallbladder is unremarkable. No evidence of biliary ductal dilatation. Pancreas: No mass or inflammatory process visualized on this unenhanced exam. Spleen:  Within normal limits in size. Adrenals/Urinary tract: No evidence of urolithiasis or hydronephrosis. Unremarkable unopacified urinary bladder. Stomach/Bowel: No evidence of obstruction, inflammatory process, or abnormal fluid collections. Normal appendix visualized. Vascular/Lymphatic: No pathologically enlarged lymph nodes identified. No evidence of abdominal aortic aneurysm. Reproductive:  No mass or other  significant abnormality. Other:  None. Musculoskeletal:  No suspicious bone lesions identified. IMPRESSION: Negative. No evidence of urolithiasis, hydronephrosis, or other acute findings. Electronically Signed   By: Danae Orleans M.D.   On: 07/19/2020 18:59    Procedures Procedures Medications Ordered in ED Medications - No data to display  ED Course  I have reviewed the triage vital signs and the nursing notes.  Pertinent labs & imaging results that were available during my care of the patient were reviewed by me and considered in my medical decision making (see chart for details).    MDM Rules/Calculators/A&P                         30 year old male who presents with concern for 3 days of hematuria and blood in his semen, as well as bilateral flank pain. No hx of STD or nephrolithiasis, but participates in high risk sexual activity.   Differential diagnosis for this patient symptoms include but are not limited to urinary tract infection, nephrolithiasis, STD, neoplasm, GU trauma, neoplasm, prostatitis, foreign body, glomerulonephritis pruritus, AVM or fistula coagulopathy.  Vital signs are normal on intake, physical exam is reassuring.  cardiopulmonary exam is normal, abdominal exam is benign, GU exam revealed normal appearing penis and testicles without skin changes or penile discharge.  Rectal exam with normal-feeling prostate without tenderness to palpation.  Prostate is not boggy or edematous, is not firm to palpation.  Stool on glove does not appear melanotic.  UA performed in triage nitrate positive was otherwise unremarkable.  Will proceed with basic laboratory studies and CT renal study. CBC unremarkable, BMP unremarkable, UA nitrate positive but otherwise unremarkable. CT renal study negative without evidence of urolithiasis, hydronephrosis, or other acute findings.  Reassuring physical exam, vital signs, laboratory studies, and imaging, no further work-up is warranted in the ED at  this time.  While exact etiology of this patient's symptoms remains unclear, there is not currently any evidence of hematuria on his workup and there does not appear to be any emergent etiology for his symptoms.  Recommend close urology follow-up and recheck of his urine within the next 2  weeks.  Additionally GC/chlamydia pending, he should abstain from sexual intercourse until test results come back.  May return to the ED or urgent care for treatment if it is positive.  Jerico voiced understanding with medical evaluation and treatment plan.  Each of his questions was answered to his expressed satisfaction.  Return precautions are given.  Patient is well-appearing, stable, appropriate for discharge at this time.  This chart was dictated using voice recognition software, Dragon. Despite the best efforts of this provider to proofread and correct errors, errors may still occur which can change documentation meaning.  Final Clinical Impression(s) / ED Diagnoses Final diagnoses:  Hematuria, unspecified type    Rx / DC Orders ED Discharge Orders    None       Paris Lore, PA-C 07/19/20 2104    Rozelle Logan, DO 07/19/20 2356

## 2020-07-19 NOTE — Discharge Instructions (Addendum)
You are seen in the ER for concern of blood in your urine.  Your physical exam, vital signs, blood work, urine test, and CT scan were very reassuring.  While the exact cause of the symptoms you experienced remains unclear, there is not appear to be any emergent issue.  You have been tested for gonorrhea and chlamydia.  You should abstain from having sex until this test results come back within the next 72 hours.  If they are positive you should return to the ER or to urgent care to be treated.  Additionally you should inform all of your partners of your test positive.  Below is contact information for Dr. Marlou Porch, the urologist, whom you may follow-up with if your symptoms persist.  Return to the emergency department develop any new pain in your testicles, new penile discharge, abdominal pain, nausea vomiting does not stop, or any other new severe symptoms.

## 2020-07-19 NOTE — ED Triage Notes (Addendum)
Patient c/o bilateral lower back pain, blood in his semen, and hematuria x 3 days.

## 2020-07-20 LAB — GC/CHLAMYDIA PROBE AMP (~~LOC~~) NOT AT ARMC
Chlamydia: NEGATIVE
Comment: NEGATIVE
Comment: NORMAL
Neisseria Gonorrhea: NEGATIVE

## 2020-07-21 LAB — URINE CULTURE: Culture: NO GROWTH

## 2022-11-11 IMAGING — CT CT RENAL STONE PROTOCOL
2 of 4 series · 16 of 46 positions shown, 18 images · non-contrast
Comparison: None.

CLINICAL DATA: Flank pain and hematuria.

EXAM:
CT ABDOMEN AND PELVIS WITHOUT CONTRAST
TECHNIQUE: Multidetector CT imaging of the abdomen and pelvis was performed
following the standard protocol without IV contrast.

[Series 2: axial st · axial · 0.94mm/px · z∈[-600,-150]mm · 13 of 102 slices shown, 15 images]
[im 6/102  soft-tissue]
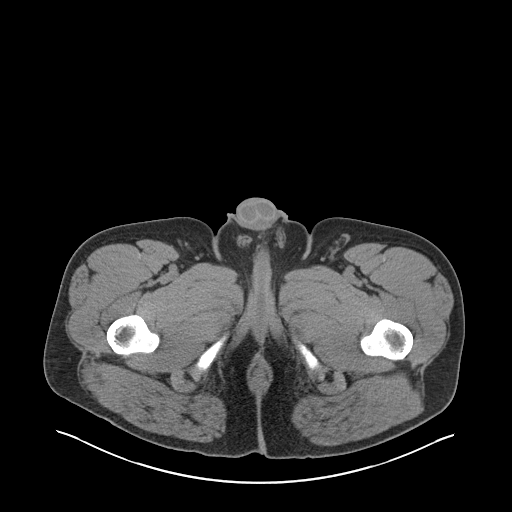
[im 6/102  bone]
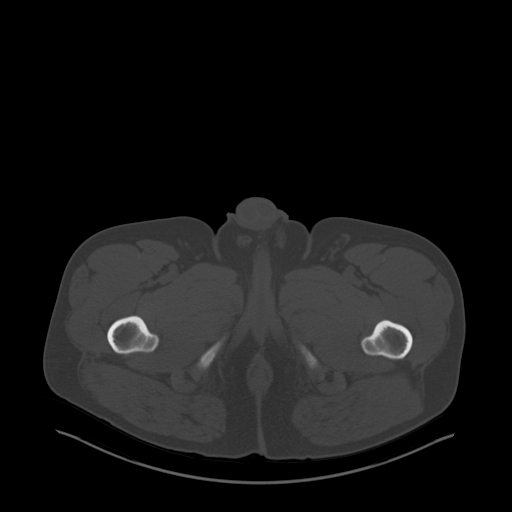
[im 16/102  soft-tissue]
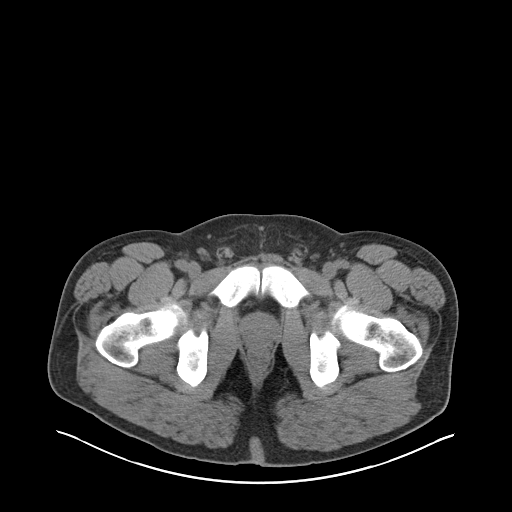
[im 22/102  soft-tissue]
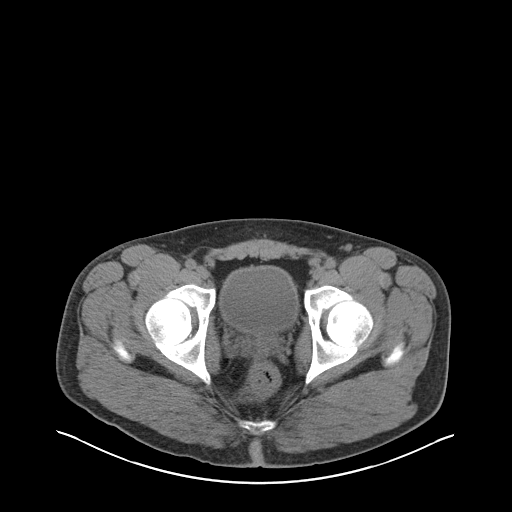
[im 27/102  soft-tissue]
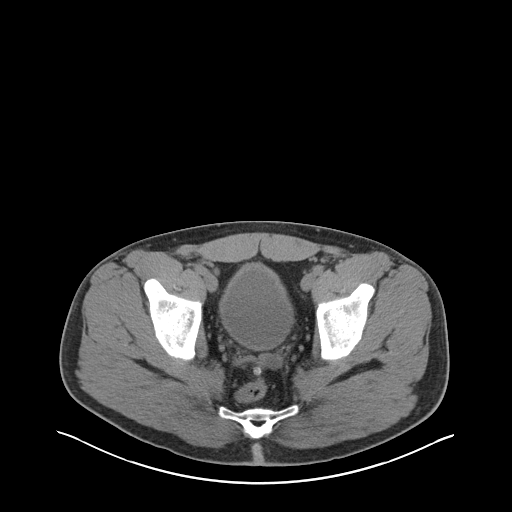
[im 38/102  soft-tissue]
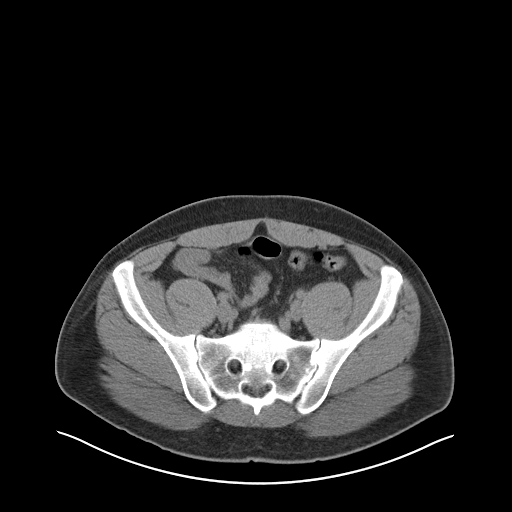
[im 43/102  soft-tissue]
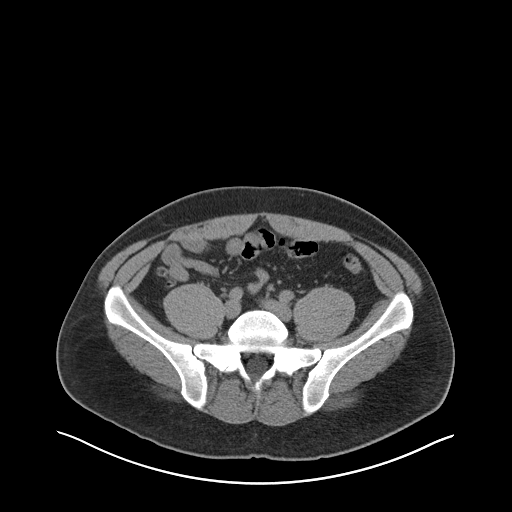
[im 54/102  soft-tissue]
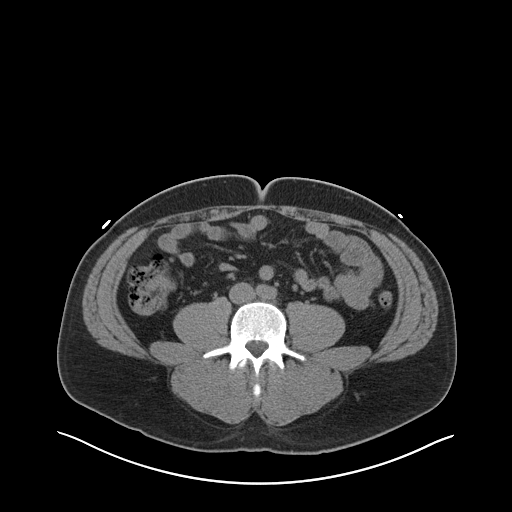
[im 59/102  soft-tissue]
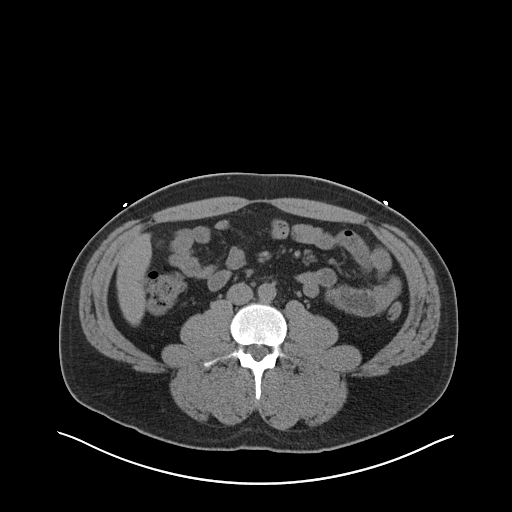
[im 64/102  soft-tissue]
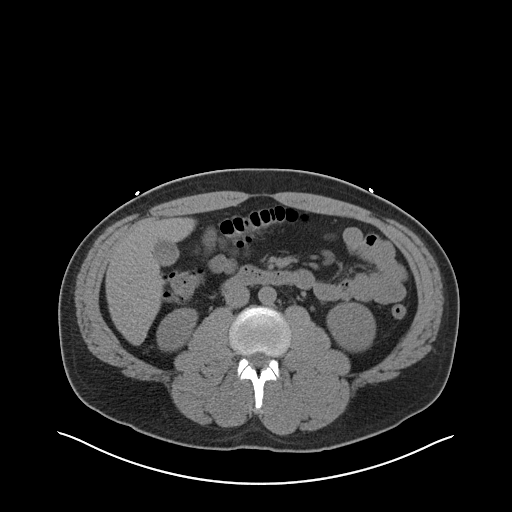
[im 64/102  bone]
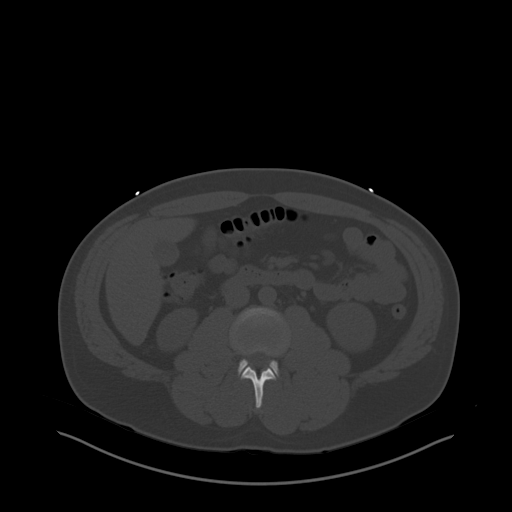
[im 75/102  soft-tissue]
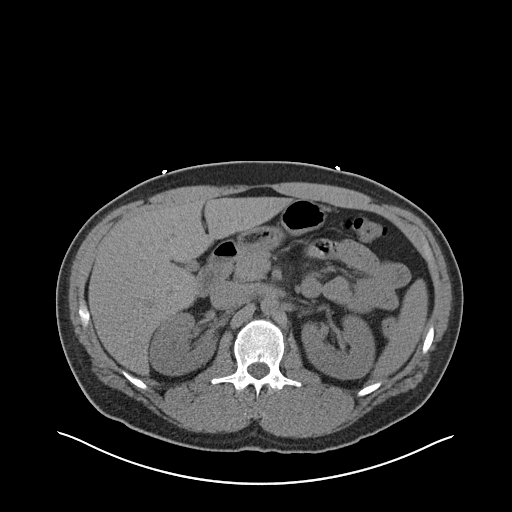
[im 80/102  soft-tissue]
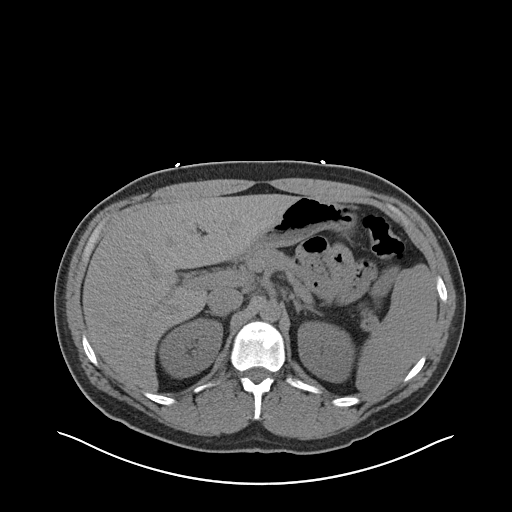
[im 86/102  soft-tissue]
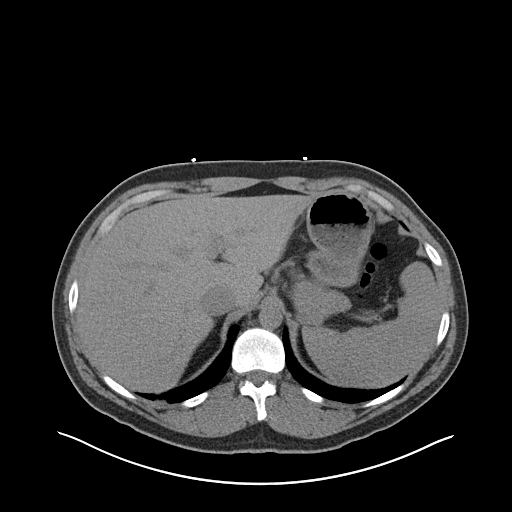
[im 96/102  soft-tissue]
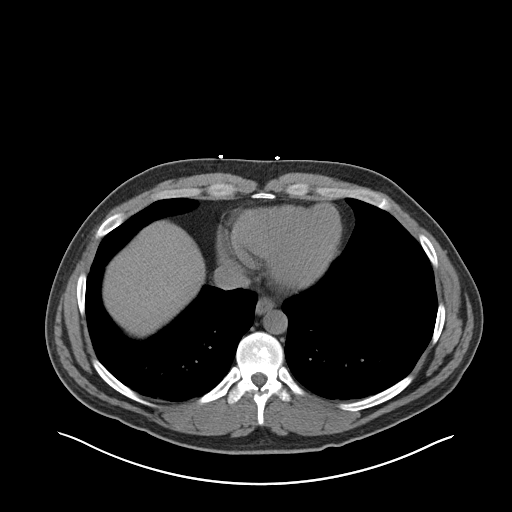

[Series 4: coronal · coronal · 0.76mm/px · 3 of 151 slices shown]
[im 51/151  soft-tissue]
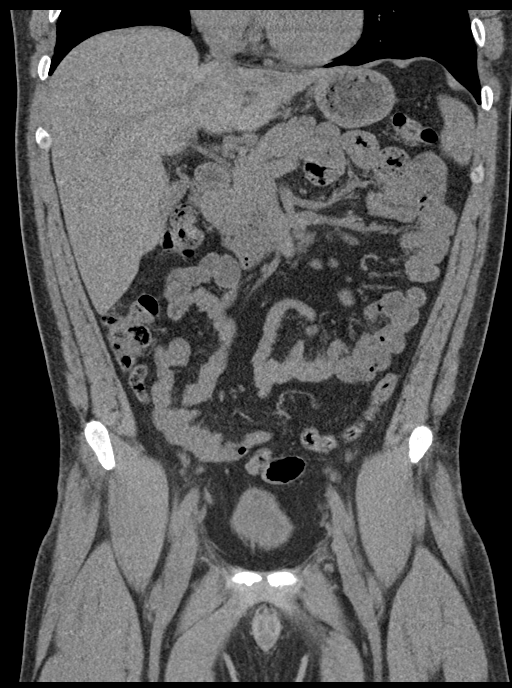
[im 67/151  soft-tissue]
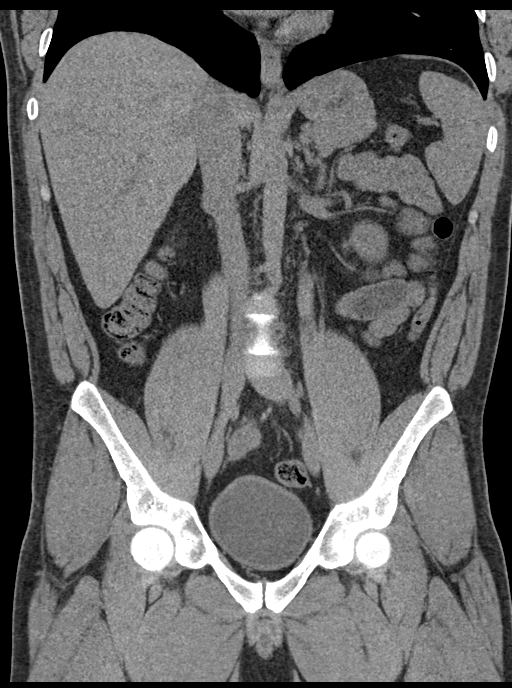
[im 84/151  soft-tissue]
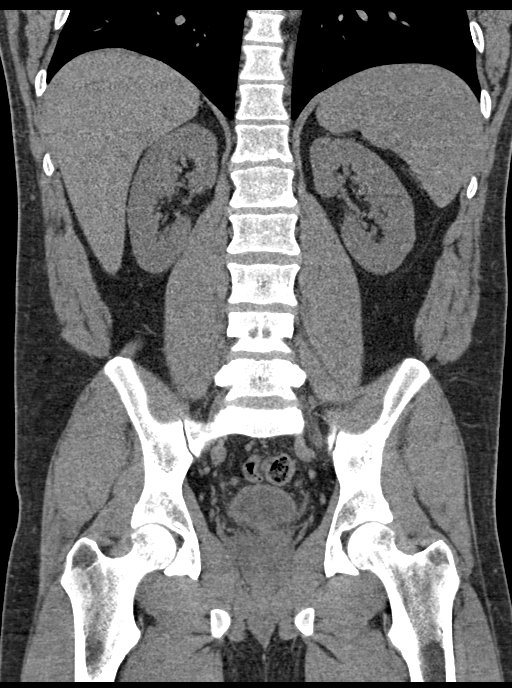

[16 of 46 positions shown; findings below may reference images not displayed]

FINDINGS: Lower chest: No acute findings.

Hepatobiliary: No mass visualized on this unenhanced exam.
Gallbladder is unremarkable. No evidence of biliary ductal
dilatation.

Pancreas: No mass or inflammatory process visualized on this
unenhanced exam.

Spleen:  Within normal limits in size.

Adrenals/Urinary tract: No evidence of urolithiasis or
hydronephrosis. Unremarkable unopacified urinary bladder.

Stomach/Bowel: No evidence of obstruction, inflammatory process, or
abnormal fluid collections. Normal appendix visualized.

Vascular/Lymphatic: No pathologically enlarged lymph nodes
identified. No evidence of abdominal aortic aneurysm.

Reproductive:  No mass or other significant abnormality.

Other:  None.

Musculoskeletal:  No suspicious bone lesions identified.
IMPRESSION: Negative. No evidence of urolithiasis, hydronephrosis, or other
acute findings.
